# Patient Record
Sex: Male | Born: 1950 | Race: White | Hispanic: No | Marital: Married | Smoking: Former smoker
Health system: Southern US, Community
[De-identification: ages and names within clinical notes are randomized; demographics above are authoritative.]

## PROBLEM LIST (undated history)

## (undated) DIAGNOSIS — F988 Other specified behavioral and emotional disorders with onset usually occurring in childhood and adolescence: Secondary | ICD-10-CM

## (undated) DIAGNOSIS — F341 Dysthymic disorder: Secondary | ICD-10-CM

## (undated) DIAGNOSIS — J302 Other seasonal allergic rhinitis: Secondary | ICD-10-CM

## (undated) DIAGNOSIS — G2581 Restless legs syndrome: Secondary | ICD-10-CM

## (undated) DIAGNOSIS — F329 Major depressive disorder, single episode, unspecified: Secondary | ICD-10-CM

## (undated) DIAGNOSIS — N400 Enlarged prostate without lower urinary tract symptoms: Secondary | ICD-10-CM

## (undated) DIAGNOSIS — Z531 Procedure and treatment not carried out because of patient's decision for reasons of belief and group pressure: Secondary | ICD-10-CM

## (undated) DIAGNOSIS — R51 Headache: Secondary | ICD-10-CM

## (undated) DIAGNOSIS — F32A Depression, unspecified: Secondary | ICD-10-CM

## (undated) DIAGNOSIS — IMO0001 Reserved for inherently not codable concepts without codable children: Secondary | ICD-10-CM

## (undated) DIAGNOSIS — G8929 Other chronic pain: Secondary | ICD-10-CM

## (undated) DIAGNOSIS — J449 Chronic obstructive pulmonary disease, unspecified: Secondary | ICD-10-CM

## (undated) DIAGNOSIS — R351 Nocturia: Secondary | ICD-10-CM

## (undated) HISTORY — DX: Other seasonal allergic rhinitis: J30.2

## (undated) HISTORY — PX: TONSILLECTOMY: SHX5217

## (undated) HISTORY — DX: Benign prostatic hyperplasia without lower urinary tract symptoms: N40.0

## (undated) HISTORY — DX: Nocturia: R35.1

## (undated) HISTORY — DX: Chronic obstructive pulmonary disease, unspecified: J44.9

## (undated) HISTORY — DX: Procedure and treatment not carried out because of patient's decision for reasons of belief and group pressure: Z53.1

## (undated) HISTORY — DX: Restless legs syndrome: G25.81

## (undated) HISTORY — DX: Dysthymic disorder: F34.1

## (undated) HISTORY — DX: Other specified behavioral and emotional disorders with onset usually occurring in childhood and adolescence: F98.8

## (undated) HISTORY — DX: Major depressive disorder, single episode, unspecified: F32.9

## (undated) HISTORY — DX: Depression, unspecified: F32.A

## (undated) HISTORY — DX: Other chronic pain: G89.29

## (undated) HISTORY — DX: Headache: R51

## (undated) HISTORY — DX: Reserved for inherently not codable concepts without codable children: IMO0001

---

## 1992-03-26 HISTORY — PX: KIDNEY STONE SURGERY: SHX686

## 2008-04-27 ENCOUNTER — Ambulatory Visit: Payer: Self-pay | Admitting: Psychology

## 2008-06-08 ENCOUNTER — Encounter: Admission: RE | Admit: 2008-06-08 | Discharge: 2008-06-08 | Payer: Self-pay | Admitting: Psychiatry

## 2011-04-05 ENCOUNTER — Other Ambulatory Visit: Payer: Self-pay | Admitting: Critical Care Medicine

## 2011-04-05 ENCOUNTER — Ambulatory Visit (HOSPITAL_BASED_OUTPATIENT_CLINIC_OR_DEPARTMENT_OTHER)
Admission: RE | Admit: 2011-04-05 | Discharge: 2011-04-05 | Disposition: A | Discharge status: Home / Self Care | Payer: BC Managed Care – PPO | Source: Ambulatory Visit | Attending: Critical Care Medicine | Admitting: Critical Care Medicine

## 2011-04-05 ENCOUNTER — Ambulatory Visit (INDEPENDENT_AMBULATORY_CARE_PROVIDER_SITE_OTHER): Payer: BC Managed Care – PPO | Admitting: Critical Care Medicine

## 2011-04-05 ENCOUNTER — Encounter: Payer: Self-pay | Admitting: Critical Care Medicine

## 2011-04-05 DIAGNOSIS — R351 Nocturia: Secondary | ICD-10-CM | POA: Insufficient documentation

## 2011-04-05 DIAGNOSIS — J4489 Other specified chronic obstructive pulmonary disease: Secondary | ICD-10-CM

## 2011-04-05 DIAGNOSIS — J449 Chronic obstructive pulmonary disease, unspecified: Secondary | ICD-10-CM

## 2011-04-05 DIAGNOSIS — R51 Headache: Secondary | ICD-10-CM

## 2011-04-05 DIAGNOSIS — F329 Major depressive disorder, single episode, unspecified: Secondary | ICD-10-CM

## 2011-04-05 DIAGNOSIS — J302 Other seasonal allergic rhinitis: Secondary | ICD-10-CM

## 2011-04-05 DIAGNOSIS — J309 Allergic rhinitis, unspecified: Secondary | ICD-10-CM

## 2011-04-05 DIAGNOSIS — F341 Dysthymic disorder: Secondary | ICD-10-CM

## 2011-04-05 DIAGNOSIS — G2581 Restless legs syndrome: Secondary | ICD-10-CM | POA: Insufficient documentation

## 2011-04-05 DIAGNOSIS — G8929 Other chronic pain: Secondary | ICD-10-CM

## 2011-04-05 DIAGNOSIS — Z87891 Personal history of nicotine dependence: Secondary | ICD-10-CM

## 2011-04-05 DIAGNOSIS — J45909 Unspecified asthma, uncomplicated: Secondary | ICD-10-CM | POA: Insufficient documentation

## 2011-04-05 DIAGNOSIS — F32A Depression, unspecified: Secondary | ICD-10-CM

## 2011-04-05 DIAGNOSIS — N400 Enlarged prostate without lower urinary tract symptoms: Secondary | ICD-10-CM

## 2011-04-05 NOTE — Assessment & Plan Note (Signed)
Probable COPD with likely reactive airways disease Need to r/o alpha one antitrypsin deficiency Note CXR 04/05/11:   Hyperexpansion c/w copd Plan Use symbicort two puff twice daily every day Alpha one test today Overnight sleep oximetry  Allergy panel today Full pulmonary function study and 6 minute walk test to be scheduled Return [place] after PFTs done

## 2011-04-05 NOTE — Patient Instructions (Addendum)
Use symbicort two puff twice daily every day Chest xray today Alpha one test today Overnight sleep oximetry  Allergy panel today Full pulmonary function study and 6 minute walk test to be scheduled Return [place] after PFTs done

## 2011-04-05 NOTE — Progress Notes (Signed)
Subjective:    Patient ID: Cristian Kenner., male    DOB: [birth year], 62 y.o.   MRN: [id number] 61 y.o. WM  HPI Comments: Dx Copd and asthma.  <57yrs ago. ??Alpha one antitrypsin.  Never tested   Shortness of Breath This is a chronic problem. The current episode started more than 1 year ago. The problem occurs daily (exertional only,  with exercise, also if gets up in middle of night will get dyspneic, ). The problem has been gradually worsening. Associated symptoms include headaches, rhinorrhea and sputum production. Pertinent negatives include no abdominal pain, chest pain, ear pain, fever, hemoptysis, leg pain, leg swelling, neck pain, orthopnea, PND, rash, sore throat, swollen glands, syncope, vomiting or wheezing. The symptoms are aggravated by exercise, eating, odors, pollens, fumes and URIs (cough is worse after eating and drinking water). Risk factors include smoking. He has tried beta agonist inhalers and steroid inhalers for the symptoms. The treatment provided mild relief. His past medical history is significant for allergies, asthma and COPD. There is no history of aspirin allergies, bronchiolitis, CAD, chronic lung disease, DVT, a heart failure, PE, pneumonia or a recent surgery. (Multiple allergies when tested as a child, took allergy shots ? if helped )  Cough This is a chronic problem. The current episode started more than 1 year ago. The problem has been unchanged. The problem occurs constantly. The cough is productive of sputum (mucus is occ yellow to tan ). Associated symptoms include headaches, heartburn, nasal congestion, postnasal drip, rhinorrhea and shortness of breath. Pertinent negatives include no chest pain, chills, ear congestion, ear pain, fever, hemoptysis, myalgias, rash, sore throat, sweats, weight loss or wheezing. Associated symptoms comments: Does have sinus congestion. The symptoms are aggravated by exercise, fumes, dust and pollens. Risk factors for lung disease include  smoking/tobacco exposure. He has tried a beta-agonist inhaler and steroid inhaler for the symptoms. The treatment provided mild relief. His past medical history is significant for asthma, bronchitis, COPD and emphysema. There is no history of bronchiectasis, environmental allergies or pneumonia. multiple allergies when tested as a child, took allergy shots ? if helped     Past Medical History  Diagnosis Date  . COPD (chronic obstructive pulmonary disease)   . Chronic headaches   . Asthma   . Seasonal allergies   . ADD (attention deficit disorder)   . Restless leg syndrome   . Refusal of blood transfusions as patient is Jehovah's Witness   . Nocturia   . BPH (benign prostatic hypertrophy)   . Dysthymic disorder   . Depression      Family History  Problem Relation Age of Onset  . Allergies Father   . Asthma Father   . Colon cancer Paternal Grandfather   . COPD Father      History   Social History  . Marital Status: Married    Spouse Name: N/A    Number of Children: 1  . Years of Education: N/A   Occupational History  . retired     Journalist, newspaper   Social History Main Topics  . Smoking status: Former Smoker -- 1.5 packs/day for 2 years    Types: Cigarettes    Quit date: 04/04/1970  . 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 Alcohol Use: Yes     gin, vodka  . Drug Use: No  . Sexually Active: Not on file   Other Topics Concern  .  Not on file   Social History Narrative  . No narrative on file     No Known Allergies   No outpatient prescriptions prior to visit.      Review of Systems  Constitutional: Positive for activity change, appetite change and fatigue. Negative for fever, chills, weight loss, diaphoresis and unexpected weight change.  HENT: Positive for hearing loss, nosebleeds, congestion, rhinorrhea, sneezing, mouth sores, trouble swallowing, postnasal drip, sinus pressure and  tinnitus. Negative for ear pain, sore throat, facial swelling, neck pain, neck stiffness, dental problem, voice change and ear discharge.   Eyes: Positive for itching and visual disturbance. Negative for photophobia and discharge.  Respiratory: Positive for cough, sputum production, choking, chest tightness and shortness of breath. Negative for apnea, hemoptysis, wheezing and stridor.   Cardiovascular: Positive for palpitations. Negative for chest pain, orthopnea, leg swelling, syncope and PND.  Gastrointestinal: Positive for heartburn. Negative for nausea, vomiting, abdominal pain, constipation, blood in stool and abdominal distention.  Genitourinary: Negative for dysuria, urgency, frequency, hematuria, flank pain, decreased urine volume and difficulty urinating.  Musculoskeletal: Positive for back pain and joint swelling. Negative for myalgias, arthralgias and gait problem.  Skin: Positive for color change. Negative for pallor and rash.  Neurological: Positive for tremors, speech difficulty, weakness, light-headedness, numbness and headaches. Negative for dizziness, seizures and syncope.  Hematological: Negative for environmental allergies and adenopathy. Bruises/bleeds easily.  Psychiatric/Behavioral: Positive for confusion and sleep disturbance. Negative for agitation. The patient is nervous/anxious.        Gets agitated and combative , cant think  Sleep study, RLS only Dx       Objective:   Physical Exam Filed Vitals:   04/05/11 0917  BP: 130/82  Pulse: 86  Temp: 98.9 F (37.2 C)  TempSrc: Oral  Height: 5\' 10"  (1.778 m)  Weight: 170 lb (77.111 kg)  SpO2: 98%    Gen: Pleasant, well-nourished, in no distress,  normal affect  ENT: No lesions,  mouth clear,  oropharynx clear, no postnasal drip  Neck: No JVD, no TMG, no carotid bruits  Lungs: No use of accessory muscles, no dullness to percussion, distant BS   Cardiovascular: RRR, heart sounds normal, no murmur or gallops, no  peripheral edema  Abdomen: soft and NT, no HSM,  BS normal  Musculoskeletal: No deformities, no cyanosis or clubbing  Neuro: alert, non focal  Skin: Warm, no lesions or rashes  Dg Chest 2 View  04/05/2011  *RADIOLOGY REPORT*  Clinical Data: COPD.; former smoker  CHEST - 2 VIEW  Comparison: None.  Findings: There is hyperexpansion which suggests an element of COPD.  The cardiac silhouette, mediastinum, pulmonary vasculature are within normal limits.  Both lungs are clear.  Mild degenerative changes are noted within the mid thoracic spine.  IMPRESSION: There is hyperexpansion which suggests an element of COPD.  Original Report Authenticated By: Brandon Melnick, M.D.          Assessment & Plan:   COPD (chronic obstructive pulmonary disease) Probable COPD with likely reactive airways disease Need to r/o alpha one antitrypsin deficiency Note CXR 04/05/11:   Hyperexpansion c/w copd Plan Use symbicort two puff twice daily every day Alpha one test today Overnight sleep oximetry  Allergy panel today Full pulmonary function study and 6 minute walk test to be scheduled Return [place] after PFTs done      Updated Medication List Outpatient Encounter Prescriptions as of 04/05/2011  Medication Sig Dispense Refill  . budesonide-formoterol (SYMBICORT) 160-4.5 MCG/ACT inhaler Inhale  2 puffs into the lungs 2 (two) times daily.      . DULoxetine (CYMBALTA) 60 MG capsule Take 60 mg by mouth daily.      Marland Kitchen lisdexamfetamine (VYVANSE) 50 MG capsule Take 25 mg by mouth every morning.      . pramipexole (MIRAPEX) 0.5 MG tablet Take 0.5 mg by mouth daily.      . solifenacin (VESICARE) 10 MG tablet Take 5 mg by mouth at bedtime.      . Tamsulosin HCl (FLOMAX) 0.4 MG CAPS Take by mouth daily after supper.

## 2011-04-06 LAB — ALLERGY FULL PROFILE
Alternaria Alternata: 4.03 kU/L — ABNORMAL HIGH (ref <0.35)
Bermuda Grass: 1.25 kU/L — ABNORMAL HIGH (ref <0.35)
Box Elder IgE: 0.61 kU/L — ABNORMAL HIGH (ref <0.35)
Candida Albicans: 0.56 kU/L — ABNORMAL HIGH (ref <0.35)
Cat Dander: <0.1 kU/L (ref <0.35)
Curvularia lunata: 0.19 kU/L (ref <0.35)
Elm IgE: 0.85 kU/L — ABNORMAL HIGH (ref <0.35)
Fescue: 3.84 kU/L — ABNORMAL HIGH (ref <0.35)
G005 Rye, Perennial: 4.15 kU/L — ABNORMAL HIGH (ref <0.35)
G009 Red Top: 3.9 kU/L — ABNORMAL HIGH (ref <0.35)
Goldenrod: 0.71 kU/L — ABNORMAL HIGH (ref <0.35)
Helminthosporium halodes: 1 kU/L — ABNORMAL HIGH (ref <0.35)
Lamb's Quarters: 0.64 kU/L — ABNORMAL HIGH (ref <0.35)

## 2011-04-12 ENCOUNTER — Telehealth: Payer: Self-pay | Admitting: Critical Care Medicine

## 2011-04-12 DIAGNOSIS — J449 Chronic obstructive pulmonary disease, unspecified: Secondary | ICD-10-CM

## 2011-04-12 NOTE — Telephone Encounter (Signed)
Call pt and tell him ONO was NORMAL  No need for oxygen

## 2011-04-13 NOTE — Telephone Encounter (Signed)
Called, spoke with pt.  I informed him ONO was normal per PW and no need for o2.  He verbalized understanding of this and voiced no further questions or concerns at this time.

## 2011-04-17 ENCOUNTER — Ambulatory Visit (INDEPENDENT_AMBULATORY_CARE_PROVIDER_SITE_OTHER): Payer: BC Managed Care – PPO | Admitting: Critical Care Medicine

## 2011-04-17 DIAGNOSIS — J449 Chronic obstructive pulmonary disease, unspecified: Secondary | ICD-10-CM

## 2011-04-17 DIAGNOSIS — J45909 Unspecified asthma, uncomplicated: Secondary | ICD-10-CM

## 2011-04-17 DIAGNOSIS — R0600 Dyspnea, unspecified: Secondary | ICD-10-CM

## 2011-04-17 DIAGNOSIS — R0989 Other specified symptoms and signs involving the circulatory and respiratory systems: Secondary | ICD-10-CM

## 2011-04-17 DIAGNOSIS — R0609 Other forms of dyspnea: Secondary | ICD-10-CM

## 2011-04-17 LAB — PULMONARY FUNCTION TEST

## 2011-04-17 NOTE — Progress Notes (Signed)
PFT was done today.  

## 2011-04-18 ENCOUNTER — Encounter: Payer: Self-pay | Admitting: Critical Care Medicine

## 2011-04-19 ENCOUNTER — Ambulatory Visit (INDEPENDENT_AMBULATORY_CARE_PROVIDER_SITE_OTHER): Payer: BC Managed Care – PPO | Admitting: Critical Care Medicine

## 2011-04-19 ENCOUNTER — Encounter: Payer: Self-pay | Admitting: Critical Care Medicine

## 2011-04-19 VITALS — BP 130/80 | HR 76 | Temp 98.2°F | Ht 69.0 in | Wt 171.0 lb

## 2011-04-19 DIAGNOSIS — J45909 Unspecified asthma, uncomplicated: Secondary | ICD-10-CM

## 2011-04-19 MED ORDER — MOMETASONE FUROATE 220 MCG/INH IN AEPB: 2.0000 | INHALATION_SPRAY | Freq: Every day | RESPIRATORY_TRACT | Status: DC

## 2011-04-19 MED ORDER — ALBUTEROL SULFATE HFA 108 (90 BASE) MCG/ACT IN AERS: 2.0000 | INHALATION_SPRAY | RESPIRATORY_TRACT | Status: AC | PRN

## 2011-04-19 MED ORDER — BECLOMETHASONE DIPROPIONATE 80 MCG/ACT NA AERS: 2.0000 | INHALATION_SPRAY | Freq: Every day | NASAL | Status: DC

## 2011-04-19 NOTE — Patient Instructions (Signed)
Stop symbicort Start Asmanex two puff daily Start Qnasl two puff daily Start proair two puff every 6 hours as needed for shortness of breath Return 3 months

## 2011-04-19 NOTE — Progress Notes (Signed)
 normal   Distance no desat.

## 2011-04-19 NOTE — Progress Notes (Signed)
Subjective:    Patient ID: Cristian Kenner., male    DOB: November 30, 1950, 61 y.o.   MRN: [id number] 61 y.o. WM  HPI 1/24 At last ov we rec symbicort two puff bid PFTs were normal Allergy test  Positive multiple allergies A1AT neg ONO normal RAp Did 483 meters    sats 97% on walk  Overall doing well  No new issues  Pt denies any significant sore throat, nasal congestion or excess secretions, fever, chills, sweats, unintended weight loss, pleurtic or exertional chest pain, orthopnea PND, or leg swelling Pt denies any increase in rescue therapy over baseline, denies waking up needing it or having any early am or nocturnal exacerbations of coughing/wheezing/or dyspnea. Pt also denies any obvious fluctuation in symptoms with  weather or environmental change or other alleviating or aggravating factors    Past Medical History  Diagnosis Date  . COPD (chronic obstructive pulmonary disease)   . Chronic headaches   . Asthma   . Seasonal allergies   . ADD (attention deficit disorder)   . Restless leg syndrome   . Refusal of blood transfusions as patient is Jehovah's Witness   . Nocturia   . BPH (benign prostatic hypertrophy)   . Dysthymic disorder   . Depression      Family History  Problem Relation Age of Onset  . Allergies Father   . Asthma Father   . Colon cancer Paternal Grandfather   . COPD Father      History   Social History  . Marital Status: Married    Spouse Name: N/A    Number of Children: 1  . Years of Education: N/A   Occupational History  . retired     Journalist, newspaper   Social History Main Topics  . Smoking status: Former Smoker -- 1.5 packs/day for 2 years    Types: Cigarettes    Quit date: 04/04/1970  . 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 Alcohol Use: Yes     gin, vodka  . Drug Use: No  . Sexually Active: Not on file   Other Topics Concern  . Not on file   Social History  Narrative  . No narrative on file     No Known Allergies   Outpatient Prescriptions Prior to Visit  Medication Sig Dispense Refill  . DULoxetine (CYMBALTA) 60 MG capsule Take 60 mg by mouth daily.      Marland Kitchen lisdexamfetamine (VYVANSE) 50 MG capsule Take 25 mg by mouth every morning.      . pramipexole (MIRAPEX) 0.5 MG tablet Take 0.5 mg by mouth daily.      . solifenacin (VESICARE) 10 MG tablet Take 5 mg by mouth at bedtime.      . Tamsulosin HCl (FLOMAX) 0.4 MG CAPS Take by mouth daily after supper.      . budesonide-formoterol (SYMBICORT) 160-4.5 MCG/ACT inhaler Inhale 2 puffs into the lungs 2 (two) times daily.          Review of Systems  Constitutional: Positive for activity change, appetite change and fatigue. Negative for diaphoresis and unexpected weight change.  HENT: Positive for hearing loss, nosebleeds, congestion, sneezing, mouth sores, trouble swallowing, sinus pressure and tinnitus. Negative for facial swelling, neck stiffness, dental problem, voice change and ear discharge.   Eyes: Positive for itching and visual disturbance. Negative for photophobia and discharge.  Respiratory: Positive for choking and chest tightness.  Negative for apnea and stridor.   Cardiovascular: Positive for palpitations.  Gastrointestinal: Negative for nausea, constipation, blood in stool and abdominal distention.  Genitourinary: Negative for dysuria, urgency, frequency, hematuria, flank pain, decreased urine volume and difficulty urinating.  Musculoskeletal: Positive for back pain and joint swelling. Negative for arthralgias and gait problem.  Skin: Positive for color change. Negative for pallor.  Neurological: Positive for tremors, speech difficulty, weakness, light-headedness and numbness. Negative for dizziness, seizures and syncope.  Hematological: Negative for adenopathy. Bruises/bleeds easily.  Psychiatric/Behavioral: Positive for confusion and sleep disturbance. Negative for agitation. The  patient is nervous/anxious.        Gets agitated and combative , cant think  Sleep study, RLS only Dx       Objective:   Physical Exam  Filed Vitals:   04/19/11 1623  BP: 130/80  Pulse: 76  Temp: 98.2 F (36.8 C)  TempSrc: Oral  Height: 5\' 9"  (1.753 m)  Weight: 171 lb (77.565 kg)  SpO2: 97%    Gen: Pleasant, well-nourished, in no distress,  normal affect  ENT: No lesions,  mouth clear,  oropharynx clear, no postnasal drip  Neck: No JVD, no TMG, no carotid bruits  Lungs: No use of accessory muscles, no dullness to percussion, distant BS   Cardiovascular: RRR, heart sounds normal, no murmur or gallops, no peripheral edema  Abdomen: soft and NT, no HSM,  BS normal  Musculoskeletal: No deformities, no cyanosis or clubbing  Neuro: alert, non focal  Skin: Warm, no lesions or rashes  No results found.        Assessment & Plan:   Extrinsic asthma, unspecified No evidence for Copd or asthma A1AT normal No indication for laba/ics Note pt with nasal inflammation Plan Stop symbicort Start Asmanex two puff daily Start Qnasl two puff daily Start proair two puff every 6 hours as needed for shortness of breath Return 3 months      Updated Medication List Outpatient Encounter Prescriptions as of 04/19/2011  Medication Sig Dispense Refill  . DULoxetine (CYMBALTA) 60 MG capsule Take 60 mg by mouth daily.      Marland Kitchen lisdexamfetamine (VYVANSE) 50 MG capsule Take 25 mg by mouth every morning.      . pramipexole (MIRAPEX) 0.5 MG tablet Take 0.5 mg by mouth daily.      . solifenacin (VESICARE) 10 MG tablet Take 5 mg by mouth at bedtime.      . Tamsulosin HCl (FLOMAX) 0.4 MG CAPS Take by mouth daily after supper.      Marland Kitchen DISCONTD: budesonide-formoterol (SYMBICORT) 160-4.5 MCG/ACT inhaler Inhale 2 puffs into the lungs 2 (two) times daily.      Marland Kitchen albuterol (PROVENTIL HFA;VENTOLIN HFA) 108 (90 BASE) MCG/ACT inhaler Inhale 2 puffs into the lungs every 4 (four) hours as needed  for wheezing or shortness of breath.  1 Inhaler  4  . Beclomethasone Dipropionate (QNASL) 80 MCG/ACT AERS Place 2 puffs into the nose daily.  1 Inhaler  6  . mometasone (ASMANEX 120 METERED DOSES) 220 MCG/INH inhaler Inhale 2 puffs into the lungs daily.  1 Inhaler  12

## 2011-04-20 NOTE — Assessment & Plan Note (Addendum)
No evidence for Copd or asthma A1AT normal No indication for laba/ics Note pt with nasal inflammation Plan Stop symbicort Start Asmanex two puff daily Start Qnasl two puff daily Start proair two puff every 6 hours as needed for shortness of breath Return 3 months

## 2011-04-26 ENCOUNTER — Encounter: Payer: Self-pay | Admitting: Critical Care Medicine

## 2011-04-27 ENCOUNTER — Telehealth: Payer: Self-pay | Admitting: Critical Care Medicine

## 2011-04-27 NOTE — Telephone Encounter (Signed)
Form received and placed in Dr. Delford Field look at for review and signature.

## 2011-04-27 NOTE — Telephone Encounter (Signed)
Initiated PA for Qnasl through [facility] at [phone]. Member ID # [id number]. Per patient he has tried Nasonex in the past. [employer] requires this be done via faxed form and will fax form for PW to complete and sign.

## 2011-04-30 ENCOUNTER — Encounter: Payer: Self-pay | Admitting: Critical Care Medicine

## 2011-05-01 NOTE — Telephone Encounter (Signed)
FA signed by PW and faxed back to [phone].  Form placed in triage with the awaiting approval/denial forms.

## 2011-05-09 NOTE — Telephone Encounter (Signed)
I called to check status of PA for Qnasl. This has been APPROVED from 05/02/11 through 01/25/14. Patient and pharmacy have been notified.

## 2011-11-15 ENCOUNTER — Ambulatory Visit (INDEPENDENT_AMBULATORY_CARE_PROVIDER_SITE_OTHER): Payer: BC Managed Care – PPO | Admitting: Critical Care Medicine

## 2011-11-15 ENCOUNTER — Encounter: Payer: Self-pay | Admitting: Critical Care Medicine

## 2011-11-15 VITALS — BP 134/80 | HR 74 | Temp 98.5°F | Ht 69.0 in | Wt 166.0 lb

## 2011-11-15 DIAGNOSIS — J45909 Unspecified asthma, uncomplicated: Secondary | ICD-10-CM

## 2011-11-15 MED ORDER — MOMETASONE FUROATE 220 MCG/INH IN AEPB: 2.0000 | INHALATION_SPRAY | Freq: Every day | RESPIRATORY_TRACT | Status: DC

## 2011-11-15 MED ORDER — BECLOMETHASONE DIPROPIONATE 80 MCG/ACT NA AERS: 2.0000 | INHALATION_SPRAY | Freq: Every day | NASAL | Status: DC

## 2011-11-15 NOTE — Patient Instructions (Signed)
No change in medications. Return in        6 months        

## 2011-11-15 NOTE — Progress Notes (Signed)
Subjective:    Patient ID: Cristian Kenner., male    DOB: Nov 15, 1950, 61 y.o.   MRN: [id number] 61 y.o. WM  HPI  1/24 At last ov we rec symbicort two puff bid PFTs were normal Allergy test  Positive multiple allergies A1AT neg ONO normal RA Did 483 meters    sats 97% on walk  Overall doing well  No new issues  Pt denies any significant sore throat, nasal congestion or excess secretions, fever, chills, sweats, unintended weight loss, pleurtic or exertional chest pain, orthopnea PND, or leg swelling Pt denies any increase in rescue therapy over baseline, denies waking up needing it or having any early am or nocturnal exacerbations of coughing/wheezing/or dyspnea. Pt also denies any obvious fluctuation in symptoms with  weather or environmental change or other alleviating or aggravating factors  11/15/2011 Dyspnea is unchanged.  Coughing after eating is better.  No real qhs dyspnea.   Pt denies any significant sore throat, nasal congestion or excess secretions, fever, chills, sweats, unintended weight loss, pleurtic or exertional chest pain, orthopnea PND, or leg swelling Pt denies any increase in rescue therapy over baseline, denies waking up needing it or having any early am or nocturnal exacerbations of coughing/wheezing/or dyspnea. Pt also denies any obvious fluctuation in symptoms with  weather or environmental change or other alleviating or aggravating factors     Past Medical History  Diagnosis Date  . COPD (chronic obstructive pulmonary disease)   . Chronic headaches   . Asthma   . Seasonal allergies   . ADD (attention deficit disorder)   . Restless leg syndrome   . Refusal of blood transfusions as patient is Jehovah's Witness   . Nocturia   . BPH (benign prostatic hypertrophy)   . Dysthymic disorder   . Depression      Family History  Problem Relation Age of Onset  . Allergies Father   . Asthma Father   . Colon cancer Paternal Grandfather   . COPD Father      History   Social History  . Marital Status: Married    Spouse Name: N/A    Number of Children: 1  . Years of Education: N/A   Occupational History  . retired     Journalist, newspaper   Social History Main Topics  . Smoking status: Former Smoker -- 1.5 packs/day for 2 years    Types: Cigarettes    Quit date: 04/04/1970  . 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 Alcohol Use: Yes     gin, vodka  . Drug Use: No  . Sexually Active: Not on file   Other Topics Concern  . Not on file   Social History Narrative  . No narrative on file     No Known Allergies   Outpatient Prescriptions Prior to Visit  Medication Sig Dispense Refill  . albuterol (PROVENTIL HFA;VENTOLIN HFA) 108 (90 BASE) MCG/ACT inhaler Inhale 2 puffs into the lungs every 4 (four) hours as needed for wheezing or shortness of breath.  1 Inhaler  4  . DULoxetine (CYMBALTA) 60 MG capsule Take 60 mg by mouth daily.      Marland Kitchen lisdexamfetamine (VYVANSE) 50 MG capsule Take 25 mg by mouth every morning.      . pramipexole (MIRAPEX) 0.5 MG tablet Take 0.5 mg by mouth daily.      . solifenacin (VESICARE) 10 MG tablet Take 5  mg by mouth at bedtime.      . Tamsulosin HCl (FLOMAX) 0.4 MG CAPS Take by mouth daily after supper.      . Beclomethasone Dipropionate (QNASL) 80 MCG/ACT AERS Place 2 puffs into the nose daily.  1 Inhaler  6  . mometasone (ASMANEX 120 METERED DOSES) 220 MCG/INH inhaler Inhale 2 puffs into the lungs daily.  1 Inhaler  12      Review of Systems  Constitutional: Positive for activity change, appetite change and fatigue. Negative for diaphoresis and unexpected weight change.  HENT: Positive for hearing loss, nosebleeds, congestion, sneezing, mouth sores, trouble swallowing, sinus pressure and tinnitus. Negative for facial swelling, neck stiffness, dental problem, voice change and ear discharge.   Eyes: Positive for itching and visual  disturbance. Negative for photophobia and discharge.  Respiratory: Positive for choking and chest tightness. Negative for apnea and stridor.   Cardiovascular: Positive for palpitations.  Gastrointestinal: Negative for nausea, constipation, blood in stool and abdominal distention.  Genitourinary: Negative for dysuria, urgency, frequency, hematuria, flank pain, decreased urine volume and difficulty urinating.  Musculoskeletal: Positive for back pain and joint swelling. Negative for arthralgias and gait problem.  Skin: Positive for color change. Negative for pallor.  Neurological: Positive for tremors, speech difficulty, weakness, light-headedness and numbness. Negative for dizziness, seizures and syncope.  Hematological: Negative for adenopathy. Bruises/bleeds easily.  Psychiatric/Behavioral: Positive for confusion and disturbed wake/sleep cycle. Negative for agitation. The patient is nervous/anxious.        Gets agitated and combative , cant think  Sleep study, RLS only Dx       Objective:   Physical Exam  Filed Vitals:   11/15/11 1558  BP: 134/80  Pulse: 74  Temp: 98.5 F (36.9 C)  TempSrc: Oral  Height: 5\' 9"  (1.753 m)  Weight: 75.297 kg (166 lb)  SpO2: 99%    Gen: Pleasant, well-nourished, in no distress,  normal affect  ENT: No lesions,  mouth clear,  oropharynx clear, no postnasal drip  Neck: No JVD, no TMG, no carotid bruits  Lungs: No use of accessory muscles, no dullness to percussion, distant BS   Cardiovascular: RRR, heart sounds normal, no murmur or gallops, no peripheral edema  Abdomen: soft and NT, no HSM,  BS normal  Musculoskeletal: No deformities, no cyanosis or clubbing  Neuro: alert, non focal  Skin: Warm, no lesions or rashes  No results found.        Assessment & Plan:   Extrinsic asthma, unspecified Mod persistent asthma stable  Plan No change in inhaled or maintenance medications. Return in  6 mo    Updated Medication  List Outpatient Encounter Prescriptions as of 11/15/2011  Medication Sig Dispense Refill  . albuterol (PROVENTIL HFA;VENTOLIN HFA) 108 (90 BASE) MCG/ACT inhaler Inhale 2 puffs into the lungs every 4 (four) hours as needed for wheezing or shortness of breath.  1 Inhaler  4  . Beclomethasone Dipropionate (QNASL) 80 MCG/ACT AERS Place 2 puffs into the nose daily.  1 Inhaler  6  . DULoxetine (CYMBALTA) 60 MG capsule Take 60 mg by mouth daily.      Marland Kitchen lisdexamfetamine (VYVANSE) 50 MG capsule Take 25 mg by mouth every morning.      . loratadine (CLARITIN) 10 MG tablet Take 10 mg by mouth daily.      . mometasone (ASMANEX 120 METERED DOSES) 220 MCG/INH inhaler Inhale 2 puffs into the lungs daily.  1 Inhaler  12  . pramipexole (MIRAPEX) 0.5 MG tablet Take  0.5 mg by mouth daily.      . solifenacin (VESICARE) 10 MG tablet Take 5 mg by mouth at bedtime.      . Tamsulosin HCl (FLOMAX) 0.4 MG CAPS Take by mouth daily after supper.      Marland Kitchen DISCONTD: Beclomethasone Dipropionate (QNASL) 80 MCG/ACT AERS Place 2 puffs into the nose daily.  1 Inhaler  6  . DISCONTD: mometasone (ASMANEX 120 METERED DOSES) 220 MCG/INH inhaler Inhale 2 puffs into the lungs daily.  1 Inhaler  12

## 2011-11-16 NOTE — Assessment & Plan Note (Signed)
Mod persistent asthma stable  Plan No change in inhaled or maintenance medications. Return in  6 mo

## 2012-12-01 ENCOUNTER — Other Ambulatory Visit: Payer: Self-pay | Admitting: *Deleted

## 2012-12-01 MED ORDER — BECLOMETHASONE DIPROPIONATE 80 MCG/ACT NA AERS: 2.0000 | INHALATION_SPRAY | Freq: Every day | NASAL | Status: DC

## 2012-12-01 MED ORDER — MOMETASONE FUROATE 220 MCG/INH IN AEPB: 2.0000 | INHALATION_SPRAY | Freq: Every day | RESPIRATORY_TRACT | Status: AC

## 2012-12-01 NOTE — Telephone Encounter (Signed)
Received faxed refill requests on Asmanex and Qnasal from [facility]. Last OV with PW: 11/15/11; was asked to f/u in 6 months. No pending OV. Called, spoke with pt.  We have scheduled him to see PW on Sept 25 at 2:15 pm in [place].  Pt aware and is aware rx sent to pharm to last until OV.  He verbalized understanding and voiced no further questions or concerns at this time.

## 2012-12-18 ENCOUNTER — Ambulatory Visit: Payer: BC Managed Care – PPO | Admitting: Critical Care Medicine

## 2013-01-05 ENCOUNTER — Ambulatory Visit: Payer: BC Managed Care – PPO | Admitting: Critical Care Medicine

## 2013-06-24 ENCOUNTER — Encounter (INDEPENDENT_AMBULATORY_CARE_PROVIDER_SITE_OTHER): Payer: Self-pay | Admitting: General Surgery

## 2013-06-24 ENCOUNTER — Ambulatory Visit (INDEPENDENT_AMBULATORY_CARE_PROVIDER_SITE_OTHER): Payer: BC Managed Care – PPO | Admitting: General Surgery

## 2013-06-24 VITALS — BP 124/80 | HR 77 | Temp 97.4°F | Resp 14 | Ht 69.0 in | Wt 163.6 lb

## 2013-06-24 DIAGNOSIS — L723 Sebaceous cyst: Secondary | ICD-10-CM

## 2013-06-24 NOTE — Progress Notes (Signed)
Subjective:     Patient ID: Cristian BornHugh Avetisyan Jr., male   DOB: [birth year], 63 y.o.   MRN: [id number]  HPI The patient is a 63 year old male who is here today chief complaint of a right shoulder cyst that has been drained. He states that for several days. He states that ruptured on its own the previous night. He is on doxycycline. He's had no fever at home.  Review of Systems  Constitutional: Negative.   HENT: Negative.   Eyes: Negative.   Respiratory: Negative.   Cardiovascular: Negative.   Gastrointestinal: Negative.   Endocrine: Negative.   Neurological: Negative.        Objective:   Physical Exam  Constitutional: He is oriented to person, place, and time. He appears well-developed and well-nourished.  HENT:  Head: Normocephalic and atraumatic.  Eyes: Conjunctivae and EOM are normal. Pupils are equal, round, and reactive to light.  Neck: Normal range of motion. Neck supple.  Cardiovascular: Normal rate, regular rhythm and normal heart sounds.   Pulmonary/Chest: Effort normal and breath sounds normal.  Abdominal: Soft. Bowel sounds are normal. He exhibits no distension and no mass. There is no tenderness. There is no rebound and no guarding.  Musculoskeletal: Normal range of motion.  Neurological: He is alert and oriented to person, place, and time.  Skin: Skin is warm and dry.          Assessment:     63 year old male with a ruptured sebaceous cyst     Plan:     1. I recommend continuing with doxycycline. 2. We'll have patient follow back up in 2 weeks for excision of a right shoulder sebaceous cyst. I'm hoping that the infection will become by this time since he is on antibiotics currently.

## 2013-07-09 ENCOUNTER — Ambulatory Visit (INDEPENDENT_AMBULATORY_CARE_PROVIDER_SITE_OTHER): Payer: BC Managed Care – PPO | Admitting: General Surgery

## 2013-07-27 ENCOUNTER — Ambulatory Visit (INDEPENDENT_AMBULATORY_CARE_PROVIDER_SITE_OTHER): Payer: BC Managed Care – PPO | Admitting: General Surgery

## 2016-12-14 ENCOUNTER — Other Ambulatory Visit: Payer: Self-pay | Admitting: Internal Medicine

## 2016-12-14 DIAGNOSIS — R1310 Dysphagia, unspecified: Secondary | ICD-10-CM

## 2016-12-18 ENCOUNTER — Other Ambulatory Visit: Payer: Self-pay

## 2016-12-21 ENCOUNTER — Other Ambulatory Visit: Payer: Self-pay

## 2017-01-11 ENCOUNTER — Ambulatory Visit
Admission: RE | Admit: 2017-01-11 | Discharge: 2017-01-11 | Disposition: A | Discharge status: Home / Self Care | Payer: Medicare Other | Source: Ambulatory Visit | Attending: Internal Medicine | Admitting: Internal Medicine

## 2017-01-11 DIAGNOSIS — R1310 Dysphagia, unspecified: Secondary | ICD-10-CM

## 2019-03-06 ENCOUNTER — Emergency Department (HOSPITAL_COMMUNITY): Payer: Medicare Other

## 2019-03-06 ENCOUNTER — Encounter (HOSPITAL_COMMUNITY): Payer: Self-pay | Admitting: Emergency Medicine

## 2019-03-06 ENCOUNTER — Other Ambulatory Visit: Payer: Self-pay

## 2019-03-06 ENCOUNTER — Emergency Department (HOSPITAL_COMMUNITY)
Admission: EM | Admit: 2019-03-06 | Discharge: 2019-03-06 | Disposition: A | Discharge status: Home / Self Care | Payer: Medicare Other | Attending: Emergency Medicine | Admitting: Emergency Medicine

## 2019-03-06 DIAGNOSIS — Z87891 Personal history of nicotine dependence: Secondary | ICD-10-CM | POA: Diagnosis not present

## 2019-03-06 DIAGNOSIS — Z79899 Other long term (current) drug therapy: Secondary | ICD-10-CM | POA: Diagnosis not present

## 2019-03-06 DIAGNOSIS — R1031 Right lower quadrant pain: Secondary | ICD-10-CM | POA: Insufficient documentation

## 2019-03-06 DIAGNOSIS — J449 Chronic obstructive pulmonary disease, unspecified: Secondary | ICD-10-CM | POA: Insufficient documentation

## 2019-03-06 DIAGNOSIS — N23 Unspecified renal colic: Secondary | ICD-10-CM | POA: Diagnosis not present

## 2019-03-06 LAB — BASIC METABOLIC PANEL
Anion gap: 6 (ref 5–15)
BUN: 27 mg/dL — ABNORMAL HIGH (ref 8–23)
CO2: 28 mmol/L (ref 22–32)
Calcium: 9.5 mg/dL (ref 8.9–10.3)
Chloride: 107 mmol/L (ref 98–111)
Creatinine, Ser: 1.32 mg/dL — ABNORMAL HIGH (ref 0.61–1.24)
GFR calc Af Amer: >60 mL/min (ref >60)
GFR calc non Af Amer: 55 mL/min — ABNORMAL LOW (ref >60)
Glucose, Bld: 122 mg/dL — ABNORMAL HIGH (ref 70–99)
Potassium: 4.4 mmol/L (ref 3.5–5.1)
Sodium: 141 mmol/L (ref 135–145)

## 2019-03-06 LAB — CBC WITH DIFFERENTIAL/PLATELET
Abs Immature Granulocytes: 0.03 10*3/uL (ref 0.00–0.07)
Basophils Absolute: 0 10*3/uL (ref 0.0–0.1)
Basophils Relative: 0 %
Eosinophils Absolute: 0 10*3/uL (ref 0.0–0.5)
Eosinophils Relative: 0 %
HCT: 45.8 % (ref 39.0–52.0)
Hemoglobin: 15.3 g/dL (ref 13.0–17.0)
Immature Granulocytes: 0 %
Lymphocytes Relative: 6 %
Lymphs Abs: 0.5 10*3/uL — ABNORMAL LOW (ref 0.7–4.0)
MCH: 31.6 pg (ref 26.0–34.0)
MCHC: 33.4 g/dL (ref 30.0–36.0)
MCV: 94.6 fL (ref 80.0–100.0)
Monocytes Absolute: 0.4 10*3/uL (ref 0.1–1.0)
Monocytes Relative: 4 %
Neutro Abs: 8.4 10*3/uL — ABNORMAL HIGH (ref 1.7–7.7)
Neutrophils Relative %: 90 %
Platelets: 177 10*3/uL (ref 150–400)
RBC: 4.84 MIL/uL (ref 4.22–5.81)
RDW: 12 % (ref 11.5–15.5)
WBC: 9.3 10*3/uL (ref 4.0–10.5)
nRBC: 0 % (ref 0.0–0.2)

## 2019-03-06 LAB — URINALYSIS, ROUTINE W REFLEX MICROSCOPIC
Bacteria, UA: NONE SEEN
Bilirubin Urine: NEGATIVE
Glucose, UA: NEGATIVE mg/dL
Ketones, ur: NEGATIVE mg/dL
Leukocytes,Ua: NEGATIVE
Nitrite: NEGATIVE
Protein, ur: NEGATIVE mg/dL
Specific Gravity, Urine: 1.025 (ref 1.005–1.030)
pH: 6 (ref 5.0–8.0)

## 2019-03-06 MED ORDER — ONDANSETRON 8 MG PO TBDP: 8.0000 mg | ORAL_TABLET | Freq: Three times a day (TID) | ORAL | 0 refills | Status: DC | PRN

## 2019-03-06 MED ORDER — HYDROCODONE-ACETAMINOPHEN 5-325 MG PO TABS: 1.0000 | ORAL_TABLET | Freq: Three times a day (TID) | ORAL | 0 refills | Status: AC | PRN

## 2019-03-06 MED ORDER — KETOROLAC TROMETHAMINE 15 MG/ML IJ SOLN
15.0000 mg | Freq: Once | INTRAMUSCULAR | Status: AC
  Administered 2019-03-06: 15 mg via INTRAVENOUS
  Filled 2019-03-06: qty 1

## 2019-03-06 MED ORDER — IOHEXOL 300 MG/ML  SOLN
100.0000 mL | Freq: Once | INTRAMUSCULAR | Status: AC | PRN
  Administered 2019-03-06: 16:00:00 100 mL via INTRAVENOUS

## 2019-03-06 MED ORDER — ACETAMINOPHEN ER 650 MG PO TBCR: 650.0000 mg | EXTENDED_RELEASE_TABLET | Freq: Three times a day (TID) | ORAL | 0 refills | Status: DC | PRN

## 2019-03-06 NOTE — ED Notes (Signed)
Patient given discharge teaching and verbalized understanding. Patient ambulated out of ED with a steady gait. Patient was unable to sign for discharge due to computer not having signature pad.   Patient wanted to leave before VS could be taken.

## 2019-03-06 NOTE — ED Notes (Signed)
Patient accidentally threw urine sample on floor. Patient will attempt to provide another sample at a later time.

## 2019-03-06 NOTE — ED Provider Notes (Signed)
[facility] DEPT Provider Note   CSN: [id number] Arrival date & time: 03/06/19  1354     History Chief Complaint  Patient presents with  . Flank Pain    Cristian Bailey. is a 68 y.o. male.  Patient with history of COPD, depression, BPH here with right-sided lower abdominal pain that started this morning around 10 or 11 AM while he was working outside.  He denies any falls or recent injury.  No lifting.  He states he had a virtual visit with his PCP this morning which was normal and regular.  He has progressively worsening right-sided lower abdominal pain that radiates to his testicles.  He has a history of kidney stones but he has never had pain like this before.  Denies any back pain.  Denies any pain with urination or blood in the urine.  No fevers, chills, nausea, vomiting or diarrhea.  No chest pain or shortness of breath. He has not tried to take any medications at home. He is not try to take anything at home for the pain.  No previous abdominal surgeries.  The history is provided by the patient and the EMS personnel.  Flank Pain Associated symptoms include abdominal pain. Pertinent negatives include no chest pain, no headaches and no shortness of breath.       Past Medical History:  Diagnosis Date  . ADD (attention deficit disorder)   . Asthma   . BPH (benign prostatic hypertrophy)   . Chronic headaches   . COPD (chronic obstructive pulmonary disease) ([place])   . Depression   . Dysthymic disorder   . Nocturia   . Refusal of blood transfusions as patient is Jehovah's Witness   . Restless leg syndrome   . Seasonal allergies     Patient Active Problem List   Diagnosis Date Noted  . Extrinsic asthma, unspecified   . Chronic headaches   . Asthma   . Seasonal allergies   . Restless leg syndrome   . Nocturia   . BPH (benign prostatic hypertrophy)   . Dysthymic disorder   . Depression     Past Surgical History:  Procedure Laterality Date   . [place]  . TONSILLECTOMY         Family History  Problem Relation Age of Onset  . Allergies Father   . Asthma Father   . COPD Father   . Colon cancer Paternal Grandfather     Social History   Tobacco Use  . Smoking status: Former Smoker    Packs/day: 1.50    Years: 2.00    Pack years: 3.00    Types: Cigarettes    Quit date: 04/04/1970    Years since quitting: 48.9  . Smokeless tobacco: Never Used  . 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 Alcohol use: Yes    Comment: gin, vodka  . Drug use: No    Home Medications Prior to Admission medications   Medication Sig Start Date End Date Taking? Authorizing Provider  albuterol (PROVENTIL HFA;VENTOLIN HFA) 108 (90 BASE) MCG/ACT inhaler Inhale 2 puffs into the lungs every 4 (four) hours as needed for wheezing or shortness of breath. 04/19/11 04/18/12  Elsie Stain, MD  Beclomethasone Dipropionate (QNASL) 80 MCG/ACT AERS Place 2 puffs into the nose daily. 12/01/12   Elsie Stain, MD  DULoxetine (CYMBALTA) 60 MG capsule Take 60 mg by mouth  daily.    [provider]  lisdexamfetamine (VYVANSE) 50 MG capsule Take 25 mg by mouth every morning.    [provider]  loratadine (CLARITIN) 10 MG tablet Take 10 mg by mouth daily.    [provider]  mometasone (ASMANEX 120 METERED DOSES) 220 MCG/INH inhaler Inhale 2 puffs into the lungs daily. 12/01/12 12/01/13  Storm FriskWright, Patrick E, MD  pramipexole (MIRAPEX) 0.5 MG tablet Take 0.5 mg by mouth daily.    [provider]  solifenacin (VESICARE) 10 MG tablet Take 5 mg by mouth at bedtime.    [provider]  Tamsulosin HCl (FLOMAX) 0.4 MG CAPS Take by mouth daily after supper.    [provider]    Allergies    Patient has no known allergies.  Review of Systems   Review of Systems  Constitutional: Negative for activity change, appetite change and  fever.  HENT: Negative for congestion and rhinorrhea.   Eyes: Negative for visual disturbance.  Respiratory: Negative for cough, chest tightness and shortness of breath.   Cardiovascular: Negative for chest pain and leg swelling.  Gastrointestinal: Positive for abdominal pain. Negative for nausea and vomiting.  Genitourinary: Positive for flank pain. Negative for dysuria, hematuria and urgency.  Musculoskeletal: Negative for arthralgias and myalgias.  Skin: Negative for rash.  Neurological: Negative for dizziness, weakness and headaches.   all other systems are negative except as noted in the HPI and PMH.   Physical Exam Updated Vital Signs BP 140/85 (BP Location: Left Arm)   Pulse 96   Temp 98.4 F (36.9 C) (Oral)   Resp 18   Ht 5\' 9"  (1.753 m)   Wt 77.1 kg   SpO2 94%   BMI 25.10 kg/m   Physical Exam Vitals and nursing note reviewed.  Constitutional:      General: He is not in acute distress.    Appearance: He is well-developed.  HENT:     Head: Normocephalic and atraumatic.     Mouth/Throat:     Pharynx: No oropharyngeal exudate.  Eyes:     Conjunctiva/sclera: Conjunctivae normal.     Pupils: Pupils are equal, round, and reactive to light.  Neck:     Comments: No meningismus. Cardiovascular:     Rate and Rhythm: Normal rate and regular rhythm.     Heart sounds: Normal heart sounds. No murmur.  Pulmonary:     Effort: Pulmonary effort is normal. No respiratory distress.     Breath sounds: Normal breath sounds.  Abdominal:     Palpations: Abdomen is soft.     Tenderness: There is abdominal tenderness. There is no guarding or rebound.     Comments: RLQ pain with guarding. No rebound  Genitourinary:    Comments: No testicular pain Musculoskeletal:        General: No tenderness. Normal range of motion.     Cervical back: Normal range of motion and neck supple.     Comments: No CVAT  No midline pain  Skin:    General: Skin is warm.     Capillary Refill: Capillary  refill takes less than 2 seconds.  Neurological:     General: No focal deficit present.     Mental Status: He is alert and oriented to person, place, and time. Mental status is at baseline.     Cranial Nerves: No cranial nerve deficit.     Motor: No abnormal muscle tone.     Coordination: Coordination normal.     Comments:  5/5 strength throughout. CN 2-12 intact.Equal grip strength.   Psychiatric:        Behavior: Behavior normal.     ED Results / Procedures / Treatments   Labs (all labs ordered are listed, but only abnormal results are displayed) Labs Reviewed  URINE CULTURE  CBC WITH DIFFERENTIAL/PLATELET  BASIC METABOLIC PANEL  URINALYSIS, ROUTINE W REFLEX MICROSCOPIC    EKG None  Radiology No results found.  Procedures Procedures (including critical care time)  Medications Ordered in ED Medications - No data to display  ED Course  I have reviewed the triage vital signs and the nursing notes.  Pertinent labs & imaging results that were available during my care of the patient were reviewed by me and considered in my medical decision making (see chart for details).    MDM Rules/Calculators/A&P     CHA2DS2/VAS Stroke Risk Points      N/A >= 2 Points: High Risk  1 - 1.99 Points: Medium Risk  0 Points: Low Risk    A final score could not be computed because of missing components.: Last  Change: N/A     This score determines the patient's risk of having a stroke if the  patient has atrial fibrillation.      This score is not applicable to this patient. Components are not  calculated.                   Right-sided abdominal pain without associated symptoms.  Does have history of kidney stones.  No distress. no peritoneal signs  We will obtain urinalysis as well as blood work.  Patient declines pain and nausea medication.  CT scan will be obtained to evaluate for appendicitis versus possible kidney stone.  Patient comfortable and continues to refuse pain  medication.  Care to be transferred at shift change. Final Clinical Impression(s) / ED Diagnoses Final diagnoses:  None    Rx / DC Orders ED Discharge Orders    None       Anderson Coppock, Jeannett Senior, MD 03/06/19 1523

## 2019-03-06 NOTE — ED Triage Notes (Signed)
Patient arrived by EMS from home. Pt c/o sudden flank pain on RT side radiating into his testicles per EMS.   History of kidney stones.   VS WDL.

## 2019-03-06 NOTE — Discharge Instructions (Addendum)
We saw you in the ER for the abdominal pain. °Our results indicate that you have a kidney stone. °We were able to get your pain is relative control, and we can safely send you home. ° °Take the meds prescribed. °Set up an appointment with the Urologist. °If the pain is unbearable, you start having fevers, chills, and are unable to keep any meds down - then return to the ER. °  °

## 2019-03-06 NOTE — ED Provider Notes (Signed)
  Physical Exam  BP 140/85 (BP Location: Left Arm)   Pulse 96   Temp 98.4 F (36.9 C) (Oral)   Resp 18   Ht 5\' 9"  (1.753 m)   Wt 77.1 kg   SpO2 94%   BMI 25.10 kg/m   Physical Exam  ED Course/Procedures     Procedures  MDM   + stones on CT scan. Pain free after toradol.  The patient appears reasonably screened and/or stabilized for discharge and I doubt any other medical condition or other [facility] requiring further screening, evaluation, or treatment in the ED at this time prior to discharge.   Results from the ER workup discussed with the patient face to face and all questions answered to the best of my ability. The patient is safe for discharge with strict return precautions.    Varney Biles, MD 03/06/19 1756

## 2019-03-07 LAB — URINE CULTURE: Culture: NO GROWTH

## 2019-05-28 ENCOUNTER — Ambulatory Visit: Payer: Medicare Other

## 2019-06-04 ENCOUNTER — Ambulatory Visit: Payer: Medicare Other | Attending: Internal Medicine

## 2019-06-04 DIAGNOSIS — Z23 Encounter for immunization: Secondary | ICD-10-CM

## 2019-06-04 NOTE — Progress Notes (Signed)
   [id number]  Name:  Gregrey Bloyd.    MRN: [id number] DOB: 09-20-1950  06/04/2019  Mr. Lichty was observed post Covid-19 immunization for 15 minutes without incident. He was provided with Vaccine Information Sheet and instruction to access the V-Safe system.   Mr. Lunden was instructed to call 911 with any severe reactions post vaccine: Marland Kitchen Difficulty breathing  . Swelling of face and throat  . A fast heartbeat  . A bad rash all over body  . Dizziness and weakness   Immunizations Administered    Name Date Dose VIS Date Route   Pfizer COVID-19 Vaccine 06/04/2019  8:15 AM 0.3 mL 03/06/2019 Intramuscular   Manufacturer: [employer], [employer]   Lot: [id number]   NDC: [address]-4

## 2019-06-29 ENCOUNTER — Ambulatory Visit: Payer: Medicare Other | Attending: Internal Medicine

## 2019-06-29 DIAGNOSIS — Z23 Encounter for immunization: Secondary | ICD-10-CM

## 2019-06-29 NOTE — Progress Notes (Signed)
   [id number]  Name:  Cristian Bailey.    MRN: [id number] DOB: 1950/10/19  06/29/2019  Mr. Cristian Bailey was observed post Covid-19 immunization for 15 minutes without incident. He was provided with Vaccine Information Sheet and instruction to access the V-Safe system.   Mr. Cristian Bailey was instructed to call 911 with any severe reactions post vaccine: Marland Kitchen Difficulty breathing  . Swelling of face and throat  . A fast heartbeat  . A bad rash all over body  . Dizziness and weakness   Immunizations Administered    Name Date Dose VIS Date Route   Pfizer COVID-19 Vaccine 06/29/2019  8:37 AM 0.3 mL 03/06/2019 Intramuscular   Manufacturer: [employer], [employer]   Lot: [id number]   NDC: [address]-7

## 2019-07-02 ENCOUNTER — Emergency Department (HOSPITAL_BASED_OUTPATIENT_CLINIC_OR_DEPARTMENT_OTHER)
Admission: EM | Admit: 2019-07-02 | Discharge: 2019-07-02 | Disposition: A | Discharge status: Home / Self Care | Payer: Medicare Other | Attending: Emergency Medicine | Admitting: Emergency Medicine

## 2019-07-02 ENCOUNTER — Other Ambulatory Visit: Payer: Self-pay

## 2019-07-02 ENCOUNTER — Emergency Department (HOSPITAL_BASED_OUTPATIENT_CLINIC_OR_DEPARTMENT_OTHER): Payer: Medicare Other

## 2019-07-02 ENCOUNTER — Encounter (HOSPITAL_BASED_OUTPATIENT_CLINIC_OR_DEPARTMENT_OTHER): Payer: Self-pay | Admitting: Emergency Medicine

## 2019-07-02 DIAGNOSIS — J449 Chronic obstructive pulmonary disease, unspecified: Secondary | ICD-10-CM | POA: Diagnosis not present

## 2019-07-02 DIAGNOSIS — Z87891 Personal history of nicotine dependence: Secondary | ICD-10-CM | POA: Insufficient documentation

## 2019-07-02 DIAGNOSIS — F909 Attention-deficit hyperactivity disorder, unspecified type: Secondary | ICD-10-CM | POA: Insufficient documentation

## 2019-07-02 DIAGNOSIS — R0789 Other chest pain: Secondary | ICD-10-CM | POA: Diagnosis not present

## 2019-07-02 DIAGNOSIS — W11XXXD Fall on and from ladder, subsequent encounter: Secondary | ICD-10-CM | POA: Insufficient documentation

## 2019-07-02 DIAGNOSIS — K5900 Constipation, unspecified: Secondary | ICD-10-CM

## 2019-07-02 DIAGNOSIS — K5641 Fecal impaction: Secondary | ICD-10-CM | POA: Diagnosis not present

## 2019-07-02 DIAGNOSIS — Z79899 Other long term (current) drug therapy: Secondary | ICD-10-CM | POA: Insufficient documentation

## 2019-07-02 LAB — CBC WITH DIFFERENTIAL/PLATELET
Abs Immature Granulocytes: 0.02 10*3/uL (ref 0.00–0.07)
Basophils Absolute: 0 10*3/uL (ref 0.0–0.1)
Basophils Relative: 0 %
Eosinophils Absolute: 0.1 10*3/uL (ref 0.0–0.5)
Eosinophils Relative: 1 %
HCT: 43.6 % (ref 39.0–52.0)
Hemoglobin: 14.7 g/dL (ref 13.0–17.0)
Immature Granulocytes: 0 %
Lymphocytes Relative: 15 %
Lymphs Abs: 1 10*3/uL (ref 0.7–4.0)
MCH: 31.7 pg (ref 26.0–34.0)
MCHC: 33.7 g/dL (ref 30.0–36.0)
MCV: 94 fL (ref 80.0–100.0)
Monocytes Absolute: 0.6 10*3/uL (ref 0.1–1.0)
Monocytes Relative: 8 %
Neutro Abs: 5 10*3/uL (ref 1.7–7.7)
Neutrophils Relative %: 76 %
Platelets: 150 10*3/uL (ref 150–400)
RBC: 4.64 MIL/uL (ref 4.22–5.81)
RDW: 12.6 % (ref 11.5–15.5)
WBC: 6.7 10*3/uL (ref 4.0–10.5)
nRBC: 0 % (ref 0.0–0.2)

## 2019-07-02 LAB — COMPREHENSIVE METABOLIC PANEL
ALT: 32 U/L (ref 0–44)
AST: 30 U/L (ref 15–41)
Albumin: 3.9 g/dL (ref 3.5–5.0)
Alkaline Phosphatase: 66 U/L (ref 38–126)
Anion gap: 7 (ref 5–15)
BUN: 19 mg/dL (ref 8–23)
CO2: 26 mmol/L (ref 22–32)
Calcium: 9 mg/dL (ref 8.9–10.3)
Chloride: 105 mmol/L (ref 98–111)
Creatinine, Ser: 0.95 mg/dL (ref 0.61–1.24)
GFR calc Af Amer: >60 mL/min (ref >60)
GFR calc non Af Amer: >60 mL/min (ref >60)
Glucose, Bld: 98 mg/dL (ref 70–99)
Potassium: 4 mmol/L (ref 3.5–5.1)
Sodium: 138 mmol/L (ref 135–145)
Total Bilirubin: 0.9 mg/dL (ref 0.3–1.2)
Total Protein: 6.7 g/dL (ref 6.5–8.1)

## 2019-07-02 MED ORDER — IOHEXOL 300 MG/ML  SOLN
100.0000 mL | Freq: Once | INTRAMUSCULAR | Status: AC | PRN
  Administered 2019-07-02: 13:00:00 100 mL via INTRAVENOUS

## 2019-07-02 NOTE — Discharge Instructions (Signed)
For the next 1-2 days do 2-3 scoops of miralax once a day to clean yourself out as you had a lot of stool today in your bowels.  All scans were normal today without traumatic injury.

## 2019-07-02 NOTE — ED Notes (Signed)
ED Provider at bedside. 

## 2019-07-02 NOTE — ED Triage Notes (Signed)
States," I am here for constipation" Last BM on Monday, denies abd pain. Wife has 2 pages of what has transpired since being constipated on Mon.

## 2019-07-02 NOTE — ED Notes (Signed)
Soap suds enema done.  Moderate amount of form stools noted.  Patient stated that he feels a lot better.  Wife at bedside during this procedure.

## 2019-07-02 NOTE — ED Provider Notes (Signed)
[facility] EMERGENCY DEPARTMENT Provider Note   CSN: [id number] Arrival date & time: 07/02/19  1126     History Chief Complaint  Patient presents with  . Constipation    Cristian Bailey. is a 69 y.o. male.  Patient is a 69 year old male with a history of COPD, depression who is presenting today with complaints of constipation for the last 3 days.  Patient reports that prior to developing the constipation he fell 2 feet off of a ladder breaking his left arm and landing on his left side.  He had some soreness in his ribs but never had any imaging.  He normally has a bowel movement daily and wife who is a nurse reports on Monday he was straining and having significant pain in his rectum and she was able to disimpact him where he had a large stool.  He has been using a stool softener and MiraLAX but has had no bowel movement yesterday or today.  He has been straining on the toilet with rectal pain and fullness but has not been able to produce a bowel movement.  He has had no nausea or vomiting and has been eating normally.  He has no back pain and no localized abdominal pain.  He denies any shortness of breath.  He reports he has not used any narcotics for the pain in his arm.  The history is provided by the patient and the spouse.  Constipation Severity:  Severe Time since last bowel movement:  3 days Timing:  Constant Progression:  Worsening Chronicity:  New Context: not narcotics   Stool description:  Hard and formed Relieved by: Disimpaction. Ineffective treatments:  Enemas, laxatives and Miralax Associated symptoms: no abdominal pain, no anorexia, no back pain, no dysuria, no nausea, no urinary retention and no vomiting        Past Medical History:  Diagnosis Date  . ADD (attention deficit disorder)   . Asthma   . BPH (benign prostatic hypertrophy)   . Chronic headaches   . COPD (chronic obstructive pulmonary disease) ([place])   . Depression   . Dysthymic disorder   .  Nocturia   . Refusal of blood transfusions as patient is Jehovah's Witness   . Restless leg syndrome   . Seasonal allergies     Patient Active Problem List   Diagnosis Date Noted  . Extrinsic asthma, unspecified   . Chronic headaches   . Asthma   . Seasonal allergies   . Restless leg syndrome   . Nocturia   . BPH (benign prostatic hypertrophy)   . Dysthymic disorder   . Depression     Past Surgical History:  Procedure Laterality Date  . [facility]  . TONSILLECTOMY         Family History  Problem Relation Age of Onset  . Allergies Father   . Asthma Father   . COPD Father   . Colon cancer Paternal Grandfather     Social History   Tobacco Use  . Smoking status: Former Smoker    Packs/day: 1.50    Years: 2.00    Pack years: 3.00    Types: Cigarettes    Quit date: 04/04/1970    Years since quitting: 49.2  . Smokeless tobacco: Never Used  . 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 Alcohol use: Yes    Comment: gin, vodka  . Drug use: No  Home Medications Prior to Admission medications   Medication Sig Start Date End Date Taking? Authorizing Provider  aspirin-acetaminophen-caffeine (EXCEDRIN MIGRAINE) [phone] MG tablet Take 1 tablet by mouth every 6 (six) hours as needed for headache.   Yes [provider]  acetaminophen (TYLENOL 8 HOUR) 650 MG CR tablet Take 1 tablet (650 mg total) by mouth every 8 (eight) hours as needed. 03/06/19   Derwood Kaplan, MD  albuterol (PROVENTIL HFA;VENTOLIN HFA) 108 (90 BASE) MCG/ACT inhaler Inhale 2 puffs into the lungs every 4 (four) hours as needed for wheezing or shortness of breath. 04/19/11 04/18/12  Storm Frisk, MD  Beclomethasone Dipropionate (QNASL) 80 MCG/ACT AERS Place 2 puffs into the nose daily. 12/01/12   Storm Frisk, MD  DULoxetine (CYMBALTA) 60 MG capsule Take 60 mg by mouth daily.    [provider]    lisdexamfetamine (VYVANSE) 50 MG capsule Take 25 mg by mouth every morning.    [provider]  loratadine (CLARITIN) 10 MG tablet Take 10 mg by mouth daily.    [provider]  mometasone (ASMANEX 120 METERED DOSES) 220 MCG/INH inhaler Inhale 2 puffs into the lungs daily. 12/01/12 12/01/13  Storm Frisk, MD  ondansetron (ZOFRAN ODT) 8 MG disintegrating tablet Take 1 tablet (8 mg total) by mouth every 8 (eight) hours as needed for nausea. 03/06/19   Derwood Kaplan, MD  pramipexole (MIRAPEX) 0.5 MG tablet Take 0.5 mg by mouth daily.    [provider]  solifenacin (VESICARE) 10 MG tablet Take 5 mg by mouth at bedtime.    [provider]  Tamsulosin HCl (FLOMAX) 0.4 MG CAPS Take by mouth daily after supper.    [provider]    Allergies    Patient has no known allergies.  Review of Systems   Review of Systems  Gastrointestinal: Positive for constipation. Negative for abdominal pain, anorexia, nausea and vomiting.  Genitourinary: Negative for dysuria.  Musculoskeletal: Negative for back pain.  All other systems reviewed and are negative.   Physical Exam Updated Vital Signs BP 134/68 (BP Location: Right Arm)   Pulse 67   Temp 98.6 F (37 C) (Oral)   Resp 16   Ht 5\' 9"  (1.753 m)   Wt 79.2 kg   SpO2 98%   BMI 25.80 kg/m   Physical Exam Vitals and nursing note reviewed.  Constitutional:      General: He is not in acute distress.    Appearance: Normal appearance. He is well-developed and normal weight.  HENT:     Head: Normocephalic and atraumatic.     Mouth/Throat:     Mouth: Mucous membranes are moist.  Eyes:     Conjunctiva/sclera: Conjunctivae normal.     Pupils: Pupils are equal, round, and reactive to light.  Cardiovascular:     Rate and Rhythm: Normal rate and regular rhythm.     Heart sounds: No murmur.  Pulmonary:     Effort: Pulmonary effort is normal. No respiratory distress.     Breath sounds: Normal breath  sounds. No wheezing or rales.  Chest:    Abdominal:     General: There is no distension.     Palpations: Abdomen is soft.     Tenderness: There is abdominal tenderness in the left upper quadrant. There is no right CVA tenderness, left CVA tenderness, guarding or rebound.  Genitourinary:    Comments: Bulky large soft stool at fingertip without external hemorrhoids or blood noted.  Small healing ecchymosis over  the left upper buttocks Musculoskeletal:        General: No tenderness. Normal range of motion.     Cervical back: Normal range of motion and neck supple.     Comments: Left wrist in a Velcro splint with surrounding ecchymosis and swelling.  No thoracic or lumbar tenderness  Skin:    General: Skin is warm and dry.     Findings: No erythema or rash.  Neurological:     Mental Status: He is alert and oriented to person, place, and time.  Psychiatric:        Behavior: Behavior normal.     ED Results / Procedures / Treatments   Labs (all labs ordered are listed, but only abnormal results are displayed) Labs Reviewed  CBC WITH DIFFERENTIAL/PLATELET  COMPREHENSIVE METABOLIC PANEL    EKG None  Radiology CT Chest W Contrast  Result Date: 07/02/2019 CLINICAL DATA:  Trauma, constipation EXAM: CT CHEST, ABDOMEN, AND PELVIS WITH CONTRAST TECHNIQUE: Multidetector CT imaging of the chest, abdomen and pelvis was performed following the standard protocol during bolus administration of intravenous contrast. CONTRAST:  OMNIPAQUE IOHEXOL 300 MG/ML  SOLN COMPARISON:  CT abdomen 03/06/2019 FINDINGS: CT CHEST FINDINGS Cardiovascular: No significant vascular findings. Normal heart size. No pericardial effusion. Mediastinum/Nodes: No enlarged mediastinal, hilar, or axillary lymph nodes. Thyroid gland, trachea, and esophagus demonstrate no significant findings. Lungs/Pleura: Mild bibasilar atelectasis. No pleural effusion or pneumothorax. Musculoskeletal: No acute fracture. CT ABDOMEN PELVIS  FINDINGS Hepatobiliary: No focal liver lesion. Punctate gallstone. No biliary dilatation. Pancreas: Unremarkable. Spleen: Unremarkable. Adrenals/Urinary Tract: Probable parapelvic cysts bilaterally. Kidneys are otherwise unremarkable. Bladder is unremarkable. Adrenals are normal in appearance. Stomach/Bowel: Very small hiatal hernia. Stomach is otherwise unremarkable. Bowel is normal in caliber. Moderate to marked stool burden. Rectum is distended with stool. Normal appendix. Vascular/Lymphatic: Mild aortic atherosclerosis.  No adenopathy. Reproductive: Stable appearance of prostate Other: The abdominal wall is unremarkable.  No ascites. Musculoskeletal: Chronic bilateral pars defects at L5 with stable grade 2/3 anterolisthesis of L5 on S1. No acute fracture. IMPRESSION: No evidence of traumatic injury. Moderate to marked stool burden with distension of the rectum. Electronically Signed   By: Guadlupe Spanish M.D.   On: 07/02/2019 13:33   CT ABDOMEN PELVIS W CONTRAST  Result Date: 07/02/2019 CLINICAL DATA:  Trauma, constipation EXAM: CT CHEST, ABDOMEN, AND PELVIS WITH CONTRAST TECHNIQUE: Multidetector CT imaging of the chest, abdomen and pelvis was performed following the standard protocol during bolus administration of intravenous contrast. CONTRAST:  OMNIPAQUE IOHEXOL 300 MG/ML  SOLN COMPARISON:  CT abdomen 03/06/2019 FINDINGS: CT CHEST FINDINGS Cardiovascular: No significant vascular findings. Normal heart size. No pericardial effusion. Mediastinum/Nodes: No enlarged mediastinal, hilar, or axillary lymph nodes. Thyroid gland, trachea, and esophagus demonstrate no significant findings. Lungs/Pleura: Mild bibasilar atelectasis. No pleural effusion or pneumothorax. Musculoskeletal: No acute fracture. CT ABDOMEN PELVIS FINDINGS Hepatobiliary: No focal liver lesion. Punctate gallstone. No biliary dilatation. Pancreas: Unremarkable. Spleen: Unremarkable. Adrenals/Urinary Tract: Probable parapelvic cysts  bilaterally. Kidneys are otherwise unremarkable. Bladder is unremarkable. Adrenals are normal in appearance. Stomach/Bowel: Very small hiatal hernia. Stomach is otherwise unremarkable. Bowel is normal in caliber. Moderate to marked stool burden. Rectum is distended with stool. Normal appendix. Vascular/Lymphatic: Mild aortic atherosclerosis.  No adenopathy. Reproductive: Stable appearance of prostate Other: The abdominal wall is unremarkable.  No ascites. Musculoskeletal: Chronic bilateral pars defects at L5 with stable grade 2/3 anterolisthesis of L5 on S1. No acute fracture. IMPRESSION: No evidence of traumatic injury. Moderate to marked stool  burden with distension of the rectum. Electronically Signed   By: Guadlupe Spanish M.D.   On: 07/02/2019 13:33    Procedures Procedures (including critical care time)  Medications Ordered in ED Medications - No data to display  ED Course  I have reviewed the triage vital signs and the nursing notes.  Pertinent labs & imaging results that were available during my care of the patient were reviewed by me and considered in my medical decision making (see chart for details).    MDM Rules/Calculators/A&P                      Well-appearing 69 year old male presenting with constipation after a fall on Monday.  Patient has some mild left upper quadrant tenderness and left-sided rib pain which was not evaluated after his fall.  Significant for forearm fracture after his fall and he is in a wrist brace.  Patient is having no vomiting or nausea concerning for obstruction.  Given symptoms all started after patient's fall and he is having left upper quadrant pain concern for possible splenic injury.  He has no evidence of back pain concerning for compression fracture.  He has no lower extremity pain or weakness.  He has normal sensation of his rectum but does have a large amount of stool in his rectum that is not able to be manually disimpacted.  We will do CT of the  chest and abdomen given patient's recent trauma and new change in bowel habits.  Patient CT of the chest abdomen and pelvis show no evidence of acute traumatic injury but does show moderate to marked stool burden with the distention of the rectum.  We will do a soapsuds enema to help evacuate the stool and encouraged patient to increase MiraLAX over the next few days to start having more regular bowel movements.  Suspect the fall may have caused a mild ileus even though he did not take narcotics.  3:30 PM Large bowel movement with soap suds enema and pt feeling better.  Will have him do miralax for the next few days.  MDM Number of Diagnoses or Management Options Constipation, unspecified constipation type: new, needed workup   Amount and/or Complexity of Data Reviewed Clinical lab tests: ordered and reviewed Tests in the radiology section of CPT: ordered and reviewed Decide to obtain previous medical records or to obtain history from someone other than the patient: yes Obtain history from someone other than the patient: yes Review and summarize past medical records: yes Discuss the patient with other providers: no Independent visualization of images, tracings, or specimens: no  Risk of Complications, Morbidity, and/or Mortality Presenting problems: low Diagnostic procedures: minimal Management options: minimal  Patient Progress Patient progress: improved    Final Clinical Impression(s) / ED Diagnoses Final diagnoses:  Constipation, unspecified constipation type  Fecal impaction in rectum [facility])    Rx / DC Orders ED Discharge Orders    None       Gwyneth Sprout, MD 07/02/19 1531

## 2020-08-09 IMAGING — CT CT ABD-PELV W/ CM
2 of 5 series · 16 of 46 positions shown, 18 images · IV contrast (omnipaque)
Comparison: None.

CLINICAL DATA: Sudden onset right flank pain radiating into the
scrotum. History of kidney stones.

EXAM:
CT ABDOMEN AND PELVIS WITH CONTRAST
TECHNIQUE: Multidetector CT imaging of the abdomen and pelvis was performed
using the standard protocol following bolus administration of
intravenous contrast.
CONTRAST:  100mL OMNIPAQUE IOHEXOL 300 MG/ML  SOLN

[Series 2: axial st · axial · 0.76mm/px · z∈[-130,+210]mm · 13 of 80 slices shown, 15 images]
[im 6/80  soft-tissue]
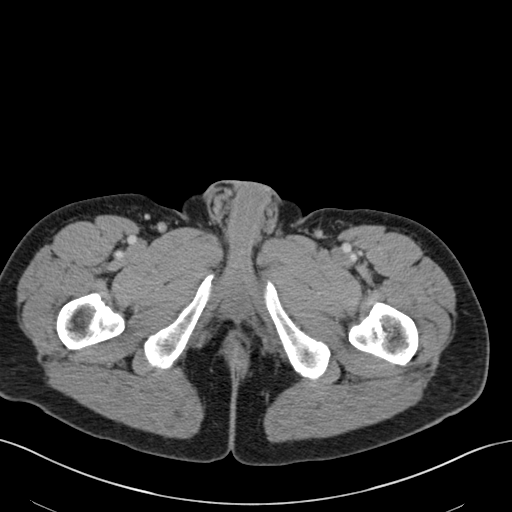
[im 6/80  bone]
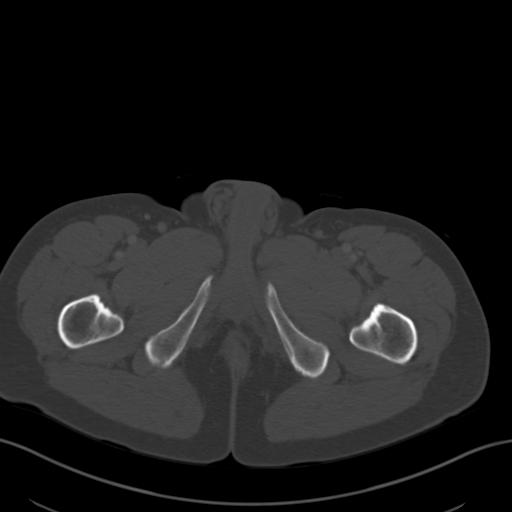
[im 12/80  soft-tissue]
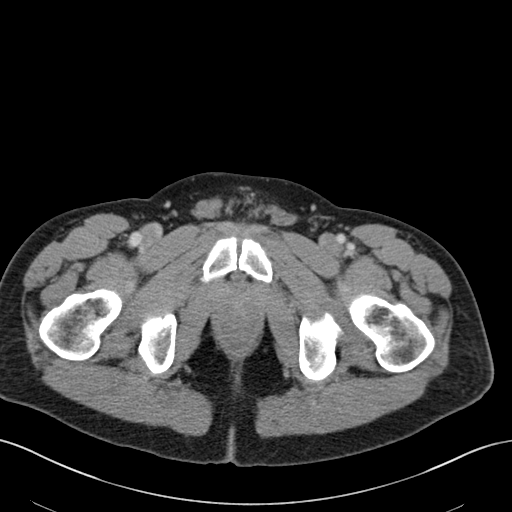
[im 17/80  soft-tissue]
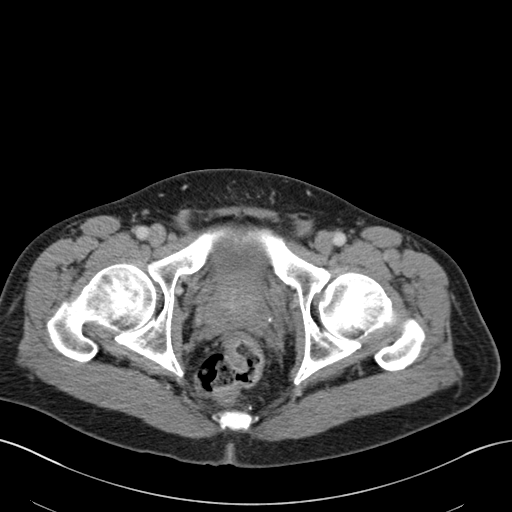
[im 23/80  soft-tissue]
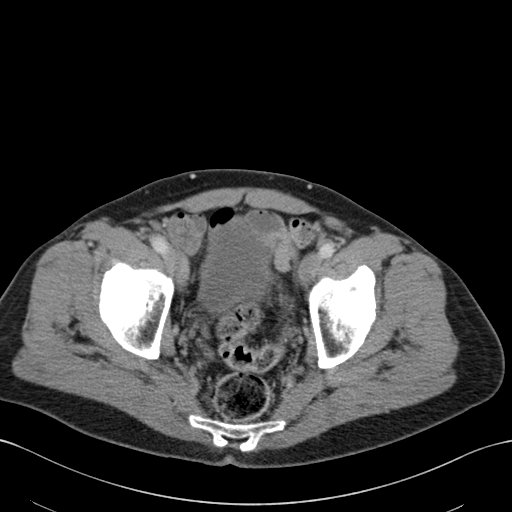
[im 29/80  soft-tissue]
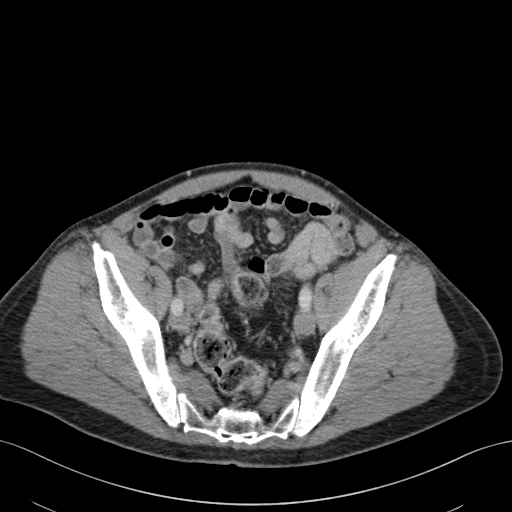
[im 34/80  soft-tissue]
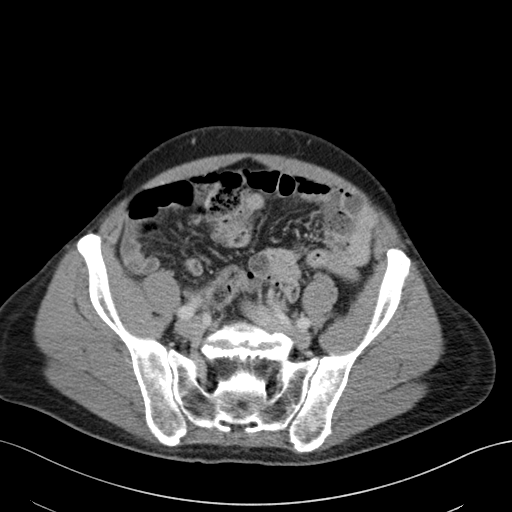
[im 40/80  soft-tissue]
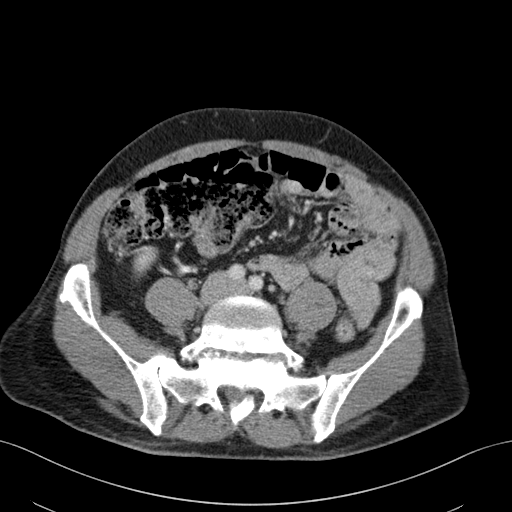
[im 46/80  soft-tissue]
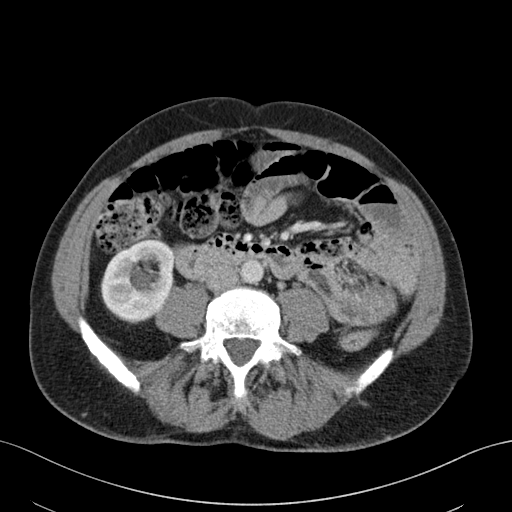
[im 51/80  soft-tissue]
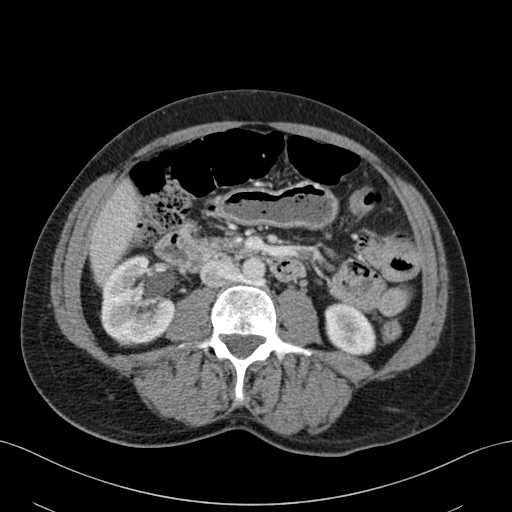
[im 51/80  bone]
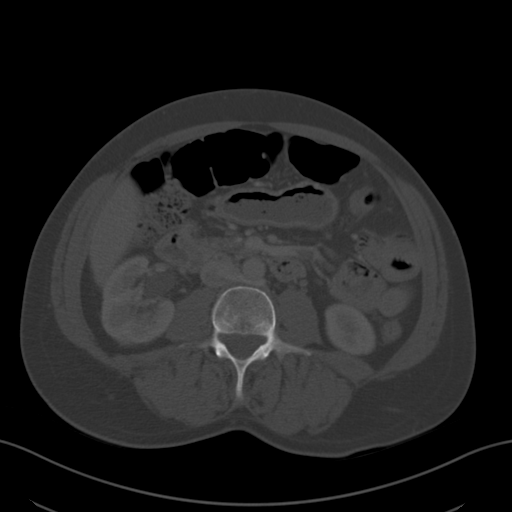
[im 57/80  soft-tissue]
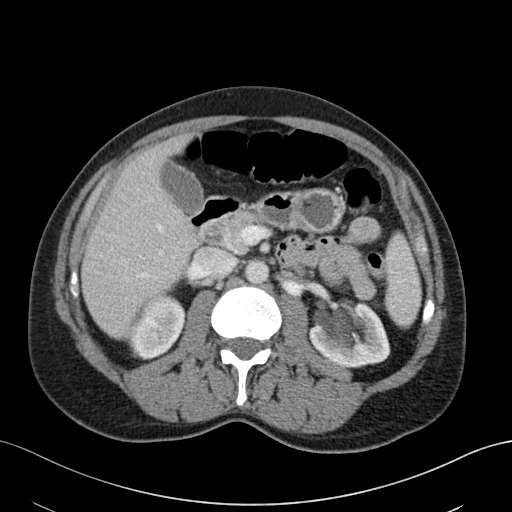
[im 63/80  soft-tissue]
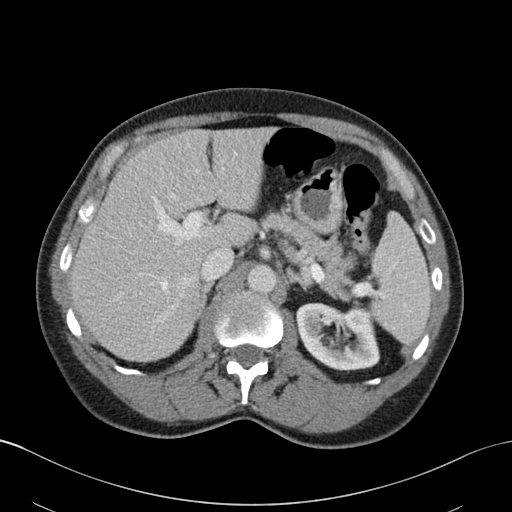
[im 68/80  soft-tissue]
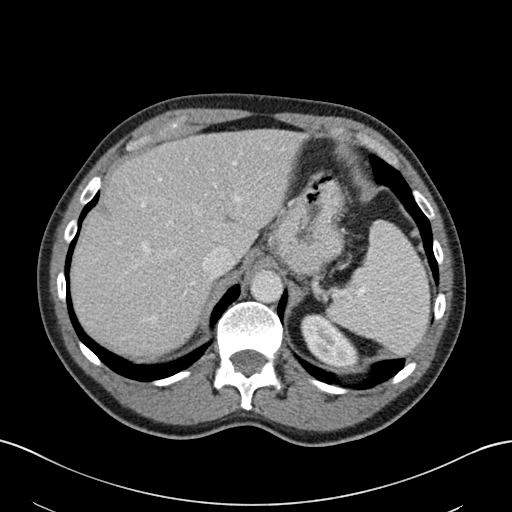
[im 74/80  soft-tissue]
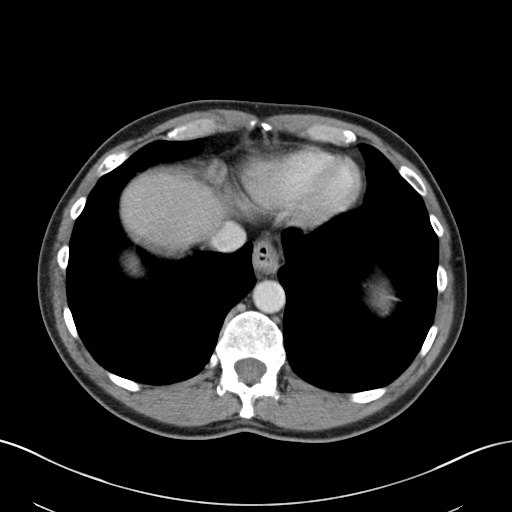

[Series 4: coronal st · coronal · 0.82mm/px · 3 of 129 slices shown]
[im 43/129  soft-tissue]
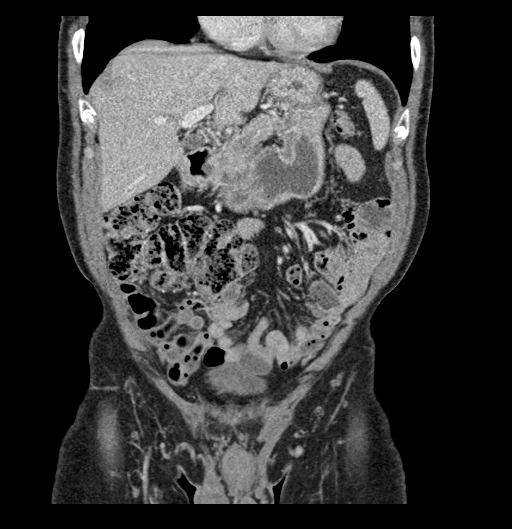
[im 57/129  soft-tissue]
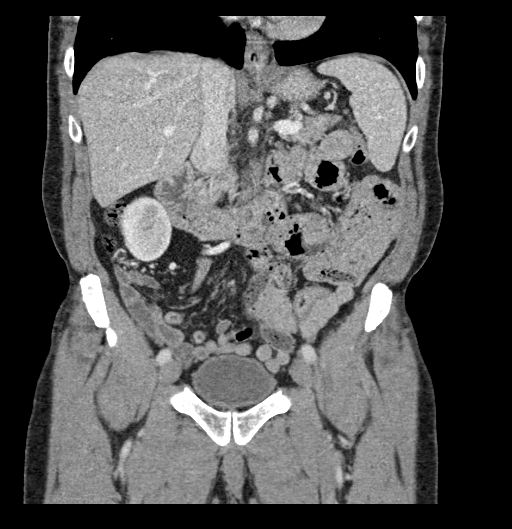
[im 72/129  soft-tissue]
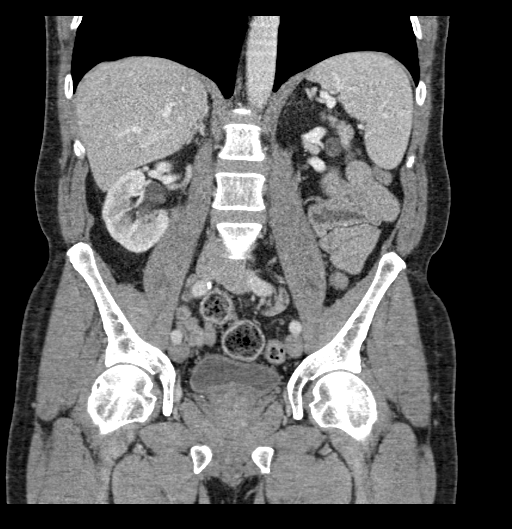

[16 of 46 positions shown; findings below may reference images not displayed]

FINDINGS: Lower chest: Minimal bibasilar atelectasis/scarring. Normal sized
heart.

Hepatobiliary: No focal liver abnormality is seen. No gallstones,
gallbladder wall thickening, or biliary dilatation.

Pancreas: Unremarkable. No pancreatic ductal dilatation or
surrounding inflammatory changes.

Spleen: Normal in size without focal abnormality.

Adrenals/Urinary Tract: 2 x 1 mm calculus in the distal right ureter
immediately proximal to the right ureterovesical junction. Mild
dilatation of the right renal collecting system and ureter to the
level of the calculus. Mild diffuse mucosal thickening and
enhancement involving the right ureter.

Multiple left renal parapelvic cysts. Unremarkable left ureter,
urinary bladder and adrenal glands.

Stomach/Bowel: Small sliding hiatal hernia. Normal appearing
appendix. Unremarkable small bowel and colon. The cecal tip is
located in the upper to mid pelvis in the midline.

Vascular/Lymphatic: No significant vascular findings are present. No
enlarged abdominal or pelvic lymph nodes.

Reproductive: Moderately enlarged prostate gland.

Other: No abdominal wall hernia or abnormality. No abdominopelvic
ascites.

Musculoskeletal: Bilateral L5 pars interarticularis defects with
associated grade 2-3 anterolisthesis at the L5-S1 level. Severe disc
space narrowing at that level with endplate irregularity and
extensive discogenic sclerosis.
IMPRESSION: 1. 2 x 1 mm distal right ureteral calculus causing mild right
hydronephrosis and hydroureter.
2. Mild diffuse right ureteral mucosal thickening and enhancement,
compatible with inflammation or infection associated with the
calculus.
3. Bilateral L5 spondylolysis with associated grade 2-3
spondylolisthesis at the L5-S1 level with marked degenerative
changes at that level.

## 2020-12-05 IMAGING — CT CT ABD-PELV W/ CM
1 series · 1 of 1 positions shown · IV contrast (omnipaque)
Comparison: CT abdomen 03/06/2019

CLINICAL DATA: Trauma, constipation

EXAM:
CT CHEST, ABDOMEN, AND PELVIS WITH CONTRAST
TECHNIQUE: Multidetector CT imaging of the chest, abdomen and pelvis was
performed following the standard protocol during bolus
administration of intravenous contrast.
CONTRAST:  100mL OMNIPAQUE IOHEXOL 300 MG/ML  SOLN

[Series 1: topogram 0.6 t20f · coronal · 2.00mm/px · 1 of 1 slices shown]
[im 1/1]
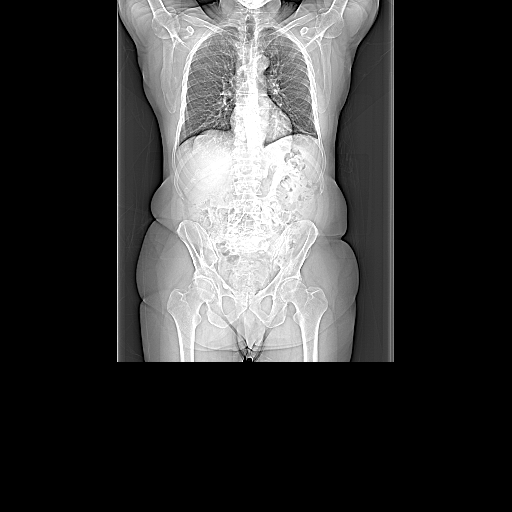

[1 of 1 positions shown; findings below may reference images not displayed]

FINDINGS: CT CHEST FINDINGS

Cardiovascular: No significant vascular findings. Normal heart size.
No pericardial effusion.

Mediastinum/Nodes: No enlarged mediastinal, hilar, or axillary lymph
nodes. Thyroid gland, trachea, and esophagus demonstrate no
significant findings.

Lungs/Pleura: Mild bibasilar atelectasis. No pleural effusion or
pneumothorax.

Musculoskeletal: No acute fracture.

CT ABDOMEN PELVIS FINDINGS

Hepatobiliary: No focal liver lesion. Punctate gallstone. No biliary
dilatation.

Pancreas: Unremarkable.

Spleen: Unremarkable.

Adrenals/Urinary Tract: Probable parapelvic cysts bilaterally.
Kidneys are otherwise unremarkable. Bladder is unremarkable.
Adrenals are normal in appearance.

Stomach/Bowel: Very small hiatal hernia. Stomach is otherwise
unremarkable. Bowel is normal in caliber. Moderate to marked stool
burden. Rectum is distended with stool. Normal appendix.

Vascular/Lymphatic: Mild aortic atherosclerosis.  No adenopathy.

Reproductive: Stable appearance of prostate

Other: The abdominal wall is unremarkable.  No ascites.

Musculoskeletal: Chronic bilateral pars defects at L5 with stable
grade [DATE] anterolisthesis of L5 on S1. No acute fracture.
IMPRESSION: No evidence of traumatic injury.

Moderate to marked stool burden with distension of the rectum.
